# Patient Record
Sex: Female | Born: 1965 | Race: Black or African American | Hispanic: No | Marital: Single | Smoking: Never smoker
Health system: Southern US, Community
[De-identification: ages and names within clinical notes are randomized; demographics above are authoritative.]

## PROBLEM LIST (undated history)

## (undated) DIAGNOSIS — J45909 Unspecified asthma, uncomplicated: Secondary | ICD-10-CM

## (undated) DIAGNOSIS — I10 Essential (primary) hypertension: Secondary | ICD-10-CM

## (undated) HISTORY — DX: Unspecified asthma, uncomplicated: J45.909

## (undated) HISTORY — DX: Essential (primary) hypertension: I10

---

## 2017-01-14 ENCOUNTER — Emergency Department: Payer: BLUE CROSS/BLUE SHIELD

## 2017-01-14 ENCOUNTER — Emergency Department
Admission: EM | Admit: 2017-01-14 | Discharge: 2017-01-14 | Payer: BLUE CROSS/BLUE SHIELD | Attending: Emergency Medicine | Admitting: Emergency Medicine

## 2017-01-14 DIAGNOSIS — Y92014 Private driveway to single-family (private) house as the place of occurrence of the external cause: Secondary | ICD-10-CM | POA: Diagnosis not present

## 2017-01-14 DIAGNOSIS — S065X0A Traumatic subdural hemorrhage without loss of consciousness, initial encounter: Secondary | ICD-10-CM | POA: Insufficient documentation

## 2017-01-14 DIAGNOSIS — S065X9A Traumatic subdural hemorrhage with loss of consciousness of unspecified duration, initial encounter: Secondary | ICD-10-CM

## 2017-01-14 DIAGNOSIS — S0990XA Unspecified injury of head, initial encounter: Secondary | ICD-10-CM | POA: Diagnosis present

## 2017-01-14 DIAGNOSIS — S065XAA Traumatic subdural hemorrhage with loss of consciousness status unknown, initial encounter: Secondary | ICD-10-CM

## 2017-01-14 DIAGNOSIS — Y9301 Activity, walking, marching and hiking: Secondary | ICD-10-CM | POA: Diagnosis not present

## 2017-01-14 DIAGNOSIS — Y999 Unspecified external cause status: Secondary | ICD-10-CM | POA: Diagnosis not present

## 2017-01-14 DIAGNOSIS — W1809XA Striking against other object with subsequent fall, initial encounter: Secondary | ICD-10-CM | POA: Insufficient documentation

## 2017-01-14 LAB — COMPREHENSIVE METABOLIC PANEL
ALT: 17 U/L (ref 14–54)
ANION GAP: 8 (ref 5–15)
AST: 26 U/L (ref 15–41)
Albumin: 4.4 g/dL (ref 3.5–5.0)
Alkaline Phosphatase: 57 U/L (ref 38–126)
BUN: 10 mg/dL (ref 6–20)
CALCIUM: 9.3 mg/dL (ref 8.9–10.3)
CHLORIDE: 102 mmol/L (ref 101–111)
CO2: 28 mmol/L (ref 22–32)
CREATININE: 0.49 mg/dL (ref 0.44–1.00)
Glucose, Bld: 93 mg/dL (ref 65–99)
Potassium: 3.2 mmol/L — ABNORMAL LOW (ref 3.5–5.1)
Sodium: 138 mmol/L (ref 135–145)
Total Bilirubin: 0.7 mg/dL (ref 0.3–1.2)
Total Protein: 8 g/dL (ref 6.5–8.1)

## 2017-01-14 LAB — CBC
HCT: 40.7 % (ref 35.0–47.0)
Hemoglobin: 13.9 g/dL (ref 12.0–16.0)
MCH: 32.1 pg (ref 26.0–34.0)
MCHC: 34.2 g/dL (ref 32.0–36.0)
MCV: 93.8 fL (ref 80.0–100.0)
PLATELETS: 317 10*3/uL (ref 150–440)
RBC: 4.33 MIL/uL (ref 3.80–5.20)
RDW: 12.7 % (ref 11.5–14.5)
WBC: 6.3 10*3/uL (ref 3.6–11.0)

## 2017-01-14 MED ORDER — SODIUM CHLORIDE 0.9 % IV BOLUS (SEPSIS)
1000.0000 mL | Freq: Once | INTRAVENOUS | Status: AC
Start: 2017-01-14 — End: 2017-01-14
  Administered 2017-01-14: 1000 mL via INTRAVENOUS

## 2017-01-14 MED ORDER — MORPHINE SULFATE (PF) 2 MG/ML IV SOLN: 1.0000 mg | Freq: Once | INTRAVENOUS | Status: DC

## 2017-01-14 MED ORDER — SODIUM CHLORIDE 0.9 % IV SOLN
500.0000 mg | Freq: Once | INTRAVENOUS | Status: AC
  Administered 2017-01-14: 500 mg via INTRAVENOUS
  Filled 2017-01-14: qty 5

## 2017-01-14 MED ORDER — ACETAMINOPHEN 325 MG PO TABS
650.0000 mg | ORAL_TABLET | Freq: Once | ORAL | Status: AC
  Administered 2017-01-14: 650 mg via ORAL
  Filled 2017-01-14: qty 2
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
## 2017-12-02 ENCOUNTER — Ambulatory Visit
Admission: RE | Admit: 2017-12-02 | Discharge: 2017-12-02 | Disposition: A | Discharge status: Home / Self Care | Payer: Managed Care, Other (non HMO) | Source: Ambulatory Visit | Attending: Family Medicine | Admitting: Family Medicine

## 2017-12-02 ENCOUNTER — Other Ambulatory Visit: Payer: Self-pay | Admitting: Family Medicine

## 2017-12-02 DIAGNOSIS — M545 Low back pain: Secondary | ICD-10-CM | POA: Diagnosis present

## 2017-12-02 DIAGNOSIS — M47896 Other spondylosis, lumbar region: Secondary | ICD-10-CM | POA: Insufficient documentation

## 2017-12-25 ENCOUNTER — Other Ambulatory Visit: Payer: Self-pay | Admitting: Family Medicine

## 2017-12-25 DIAGNOSIS — Z1231 Encounter for screening mammogram for malignant neoplasm of breast: Secondary | ICD-10-CM

## 2019-06-17 DIAGNOSIS — Z111 Encounter for screening for respiratory tuberculosis: Secondary | ICD-10-CM | POA: Diagnosis not present

## 2019-06-17 DIAGNOSIS — L02212 Cutaneous abscess of back [any part, except buttock]: Secondary | ICD-10-CM | POA: Diagnosis not present

## 2019-06-17 DIAGNOSIS — N63 Unspecified lump in unspecified breast: Secondary | ICD-10-CM | POA: Diagnosis not present

## 2019-06-17 DIAGNOSIS — L0291 Cutaneous abscess, unspecified: Secondary | ICD-10-CM | POA: Diagnosis not present

## 2019-06-17 DIAGNOSIS — I10 Essential (primary) hypertension: Secondary | ICD-10-CM | POA: Diagnosis not present

## 2019-06-17 DIAGNOSIS — Z23 Encounter for immunization: Secondary | ICD-10-CM | POA: Diagnosis not present

## 2019-06-17 DIAGNOSIS — M545 Low back pain: Secondary | ICD-10-CM | POA: Diagnosis not present

## 2019-06-21 DIAGNOSIS — N6320 Unspecified lump in the left breast, unspecified quadrant: Secondary | ICD-10-CM | POA: Diagnosis not present

## 2019-06-21 DIAGNOSIS — M545 Low back pain: Secondary | ICD-10-CM | POA: Diagnosis not present

## 2019-06-21 DIAGNOSIS — M5431 Sciatica, right side: Secondary | ICD-10-CM | POA: Diagnosis not present

## 2019-06-21 DIAGNOSIS — R079 Chest pain, unspecified: Secondary | ICD-10-CM | POA: Diagnosis not present

## 2019-06-21 DIAGNOSIS — R9431 Abnormal electrocardiogram [ECG] [EKG]: Secondary | ICD-10-CM | POA: Diagnosis not present

## 2019-06-24 DIAGNOSIS — R0602 Shortness of breath: Secondary | ICD-10-CM | POA: Diagnosis not present

## 2019-06-24 DIAGNOSIS — I208 Other forms of angina pectoris: Secondary | ICD-10-CM | POA: Diagnosis not present

## 2019-06-24 DIAGNOSIS — J4520 Mild intermittent asthma, uncomplicated: Secondary | ICD-10-CM | POA: Diagnosis not present

## 2019-06-24 DIAGNOSIS — E6609 Other obesity due to excess calories: Secondary | ICD-10-CM | POA: Diagnosis not present

## 2019-06-30 DIAGNOSIS — N6325 Unspecified lump in the left breast, overlapping quadrants: Secondary | ICD-10-CM | POA: Diagnosis not present

## 2019-06-30 DIAGNOSIS — N63 Unspecified lump in unspecified breast: Secondary | ICD-10-CM | POA: Diagnosis not present

## 2019-07-13 ENCOUNTER — Other Ambulatory Visit: Payer: Self-pay

## 2019-07-13 ENCOUNTER — Ambulatory Visit (LOCAL_COMMUNITY_HEALTH_CENTER): Payer: Self-pay

## 2019-07-13 ENCOUNTER — Other Ambulatory Visit: Payer: Managed Care, Other (non HMO)

## 2019-07-13 DIAGNOSIS — Z111 Encounter for screening for respiratory tuberculosis: Secondary | ICD-10-CM

## 2019-07-13 DIAGNOSIS — R0602 Shortness of breath: Secondary | ICD-10-CM | POA: Diagnosis not present

## 2019-07-13 DIAGNOSIS — I208 Other forms of angina pectoris: Secondary | ICD-10-CM | POA: Diagnosis not present

## 2019-07-16 ENCOUNTER — Other Ambulatory Visit: Payer: Self-pay

## 2019-07-16 ENCOUNTER — Ambulatory Visit (LOCAL_COMMUNITY_HEALTH_CENTER): Payer: Self-pay

## 2019-07-16 DIAGNOSIS — Z111 Encounter for screening for respiratory tuberculosis: Secondary | ICD-10-CM

## 2019-07-16 LAB — TB SKIN TEST
Induration: 0 mm
TB Skin Test: NEGATIVE

## 2019-07-22 DIAGNOSIS — J4520 Mild intermittent asthma, uncomplicated: Secondary | ICD-10-CM | POA: Diagnosis not present

## 2019-07-22 DIAGNOSIS — I208 Other forms of angina pectoris: Secondary | ICD-10-CM | POA: Diagnosis not present

## 2019-07-22 DIAGNOSIS — E6609 Other obesity due to excess calories: Secondary | ICD-10-CM | POA: Diagnosis not present

## 2019-07-22 DIAGNOSIS — R0602 Shortness of breath: Secondary | ICD-10-CM | POA: Diagnosis not present

## 2019-08-24 DIAGNOSIS — Z20828 Contact with and (suspected) exposure to other viral communicable diseases: Secondary | ICD-10-CM | POA: Diagnosis not present

## 2019-08-31 DIAGNOSIS — I10 Essential (primary) hypertension: Secondary | ICD-10-CM | POA: Diagnosis not present

## 2019-08-31 DIAGNOSIS — Z79899 Other long term (current) drug therapy: Secondary | ICD-10-CM | POA: Diagnosis not present

## 2019-08-31 DIAGNOSIS — E876 Hypokalemia: Secondary | ICD-10-CM | POA: Diagnosis not present

## 2019-08-31 DIAGNOSIS — J4520 Mild intermittent asthma, uncomplicated: Secondary | ICD-10-CM | POA: Diagnosis not present

## 2019-08-31 DIAGNOSIS — S161XXA Strain of muscle, fascia and tendon at neck level, initial encounter: Secondary | ICD-10-CM | POA: Diagnosis not present

## 2019-11-01 DIAGNOSIS — Z6841 Body Mass Index (BMI) 40.0 and over, adult: Secondary | ICD-10-CM | POA: Diagnosis not present

## 2019-11-24 DIAGNOSIS — M545 Low back pain: Secondary | ICD-10-CM | POA: Diagnosis not present

## 2019-11-29 DIAGNOSIS — X501XXA Overexertion from prolonged static or awkward postures, initial encounter: Secondary | ICD-10-CM | POA: Diagnosis not present

## 2019-11-29 DIAGNOSIS — S39012A Strain of muscle, fascia and tendon of lower back, initial encounter: Secondary | ICD-10-CM | POA: Diagnosis not present

## 2019-11-29 DIAGNOSIS — Y9301 Activity, walking, marching and hiking: Secondary | ICD-10-CM | POA: Diagnosis not present

## 2020-02-10 DIAGNOSIS — N390 Urinary tract infection, site not specified: Secondary | ICD-10-CM | POA: Diagnosis not present

## 2020-02-10 DIAGNOSIS — N760 Acute vaginitis: Secondary | ICD-10-CM | POA: Diagnosis not present

## 2020-02-10 DIAGNOSIS — B373 Candidiasis of vulva and vagina: Secondary | ICD-10-CM | POA: Diagnosis not present

## 2020-02-22 ENCOUNTER — Other Ambulatory Visit: Payer: Self-pay | Admitting: *Deleted

## 2020-02-22 NOTE — Patient Outreach (Signed)
[facility]) Care Management  02/22/2020  Ashley Meyer 1966/03/16 [id number]   Subjective: Telephone call to patient's home number, no answer, left HIPAA compliant voicemail message, and requested call back.   Objective: Per KPN (Knowledge Performance Now, point of care tool) and chart review, patient has not had any recent hospitalizations or ED visits. Patient also has a history of hypertension, migraine, asthma, Multiple thyroid nodules,  And Bilateral presbyopia.      Assessment:  Received Arts development officer Hypertension Initiative referral on 01/24/2020.  Referral source: [employer]. Referral reason: " As a benefit of your [employer] plan, you have been chosen to participate in a blood pressure program. You will receive a blood pressure monitor in the mail with information to help you better manage your health."  Screening  follow up pending patient contact.      Plan: RNCM will send unsuccessful outreach  letter, [facility] pamphlet, will call patient for 2nd telephone outreach attempt within 4 business days, screening follow up, and will proceed with case closure within 10 business days if no return call, if 4th unsuccessful outreach call.     Margret Moat H. Gardiner Barefoot, BSN, CCM [facility] Care Management [facility] Telephonic CM Phone: [phone] Fax: [phone]

## 2020-02-25 ENCOUNTER — Encounter: Payer: Self-pay | Admitting: *Deleted

## 2020-02-25 ENCOUNTER — Other Ambulatory Visit: Payer: Self-pay | Admitting: *Deleted

## 2020-02-25 NOTE — Patient Outreach (Signed)
[facility]) Care Management  02/25/2020  Ashley Meyer 15-Sep-1966 [id number]   Subjective: Telephone call to patient's home / mobile number, spoke with patient, and HIPAA verified.  Discussed [facility] Care Management [employer] Commercial Hypertension Initiative referral follow up, patient voiced understanding, and is in agreement to follow up.  Patient states she is doing well, blood pressure monitor is broken, and last blood pressure was 128/68 on 02/23/2020.  States is normally takes blood pressure daily but monitor has been broken since 02/23/2020.  RNCM advised will send patient new blood pressure monitor.  Patient states he is able to manage self care and has assistance as needed.  Patient voices understanding of medical diagnosis and treatment plan.  Patient states she had surgery to remove a growth on thyroid in 2008.   States she has seen a cardiologist in the past due to irregular heart beat related to low potassium, cleared by cardiologist, and will have a follow up in 1 year.  Patient states she has no upcoming scheduled appointments and will be scheduling a physical in the near future.  States her primary provider is with [facility] and she sees whoever is available.  States she is accessing her [employer] benefits as needed via member services number on back of card.  Patient states she does not have any transportation, [employer], or pharmacy needs at this time.  States she is very appreciative of the follow up and is in agreement to continue to receive [facility] Care Management information / services.    Objective: Per KPN (Knowledge Performance Now, point of care tool) and chart review, patient has not had any recent hospitalizations or ED visits. Patient also has a history of hypertension, migraine, asthma, Multiple thyroid nodules,  and Bilateral presbyopia.      Assessment:  Received Arts development officer Hypertension Initiative referral on 01/24/2020.   Referral source: [employer]. Referral reason: " As a benefit of your [employer] plan, you have been chosen to participate in a blood pressure program. You will receive a blood pressure monitor in the mail with information to help you better manage your health."  Screening follow up completed and will follow up to completed hypertension assessment.      Plan: RNCM will send patient welcome outreach letter, welcome packet, consent, THN pamphlet, blood pressure monitor, low salt chart/ guide, Advanced Directive packet, and magnet. RNCM will call patient for telephone outreach attempt, within 14 business days, to complete hypertension assessment , and proceed with case closure, within 10 business days if no return call, after 4th unsuccessful outreach call.      Darlene H. Gardiner Barefoot, BSN, CCM [facility] Care Management [facility] Telephonic CM Phone: [phone] Fax: [phone]

## 2020-03-09 ENCOUNTER — Ambulatory Visit: Payer: Self-pay | Admitting: *Deleted

## 2020-03-09 DIAGNOSIS — H40023 Open angle with borderline findings, high risk, bilateral: Secondary | ICD-10-CM | POA: Diagnosis not present

## 2020-03-09 DIAGNOSIS — H2513 Age-related nuclear cataract, bilateral: Secondary | ICD-10-CM | POA: Diagnosis not present

## 2020-03-10 ENCOUNTER — Ambulatory Visit: Payer: Self-pay | Admitting: *Deleted

## 2020-03-13 ENCOUNTER — Ambulatory Visit: Payer: Self-pay | Admitting: *Deleted

## 2020-03-14 ENCOUNTER — Ambulatory Visit: Payer: Self-pay | Admitting: *Deleted

## 2020-03-15 ENCOUNTER — Other Ambulatory Visit: Payer: Self-pay | Admitting: *Deleted

## 2020-03-15 NOTE — Patient Outreach (Addendum)
[facility]) Care Management  [place]  03/15/2020   Ashley Meyer May 03, 1966 [id number]  Subjective: Telephone call to patient's home number, spoke with patient, and HIPAA verified.  Patient states she is doing well and things are going good.   States she received [facility] Care Management information and blood pressure monitor, very appreciative, and has no questions regarding educational materials.  States she modifying her diet and has lost 8 lbs to date. States her blood pressure has been in the 128/82 - 129/84 range and taking medications as prescribed.  States she has no upcoming MD appointments in the near future.  Patient states she has had periodic pain in right great toe since she had a pedicure last week, no signs/ symptoms of infection, and is planning to follow up with MD if needed. Patient states she is aware of signs/ symptoms to report, how to reach provider if needed after hours, when to go to ED, and / or call 911.  States she is planning to follow up with gastroenterologist regarding scheduling a follow up colonoscopy as a recommended by provider approximately 2 years ago,  since it was delayed due to Covid restrictions, and things are opening back up.  Patient states she does not have any transportation, [employer], or pharmacy needs at this time. States she is very appreciative of the follow up and is in agreement to continue to receive [facility] Management information / services.    Objective: Per KPN (Knowledge Performance Now, point of care tool) and chart review,patient has not had any recent hospitalizations or ED visits. Patient also has a history of hypertension, migraine, asthma,Multiple thyroid nodules, and Bilateral presbyopia.    Encounter Medications:  Outpatient Encounter Medications as of 03/15/2020  Medication Sig Note  . albuterol (VENTOLIN HFA) 108 (90 Base) MCG/ACT inhaler Inhale into the lungs.   Marland Kitchen amLODipine (NORVASC) 5 MG  tablet Take 5 mg by mouth daily.   . cetirizine (ZYRTEC) 10 MG tablet Take by mouth.   . hydrochlorothiazide (HYDRODIURIL) 12.5 MG tablet Take by mouth.   . Multiple Vitamin (MULTIVITAMIN) capsule Take by mouth.   . nitroGLYCERIN (NITROSTAT) 0.3 MG SL tablet TAKE AS DIRECTED FOR CHEST PAIN 02/25/2020: States has not needed.   . potassium chloride (MICRO-K) 10 MEQ CR capsule TAKE 1 CAPSULE(10 MEQ) BY MOUTH EVERY DAY    No facility-administered encounter medications on file as of 03/15/2020.    Functional Status:  In your present state of health, do you have any difficulty performing the following activities: 03/15/2020 02/25/2020  Hearing? N -  Vision? N -  Difficulty concentrating or making decisions? N -  Walking or climbing stairs? - N  Dressing or bathing? - N  Doing errands, shopping? - N  Conservation officer, nature and eating ? - N  Using the Toilet? - N  In the past six months, have you accidently leaked urine? N -  Do you have problems with loss of bowel control? N -  Managing your Medications? - N  Managing your Finances? - N  Housekeeping or managing your Housekeeping? - N  Some recent data might be hidden    Fall/Depression Screening: Fall Risk  02/25/2020  Falls in the past year? 1  Number falls in past yr: 0  Injury with Fall? 1  Risk for fall due to : History of fall(s)  Follow up Falls evaluation completed;Education provided;Falls prevention discussed   PHQ 2/9 Scores 02/25/2020  PHQ - 2 Score 0  Assessment: Received Arts development officer Hypertension Initiative referral on 01/24/2020. Referral source: [employer]. Referral reason:"As a benefit of your [employer] plan, you have been chosen to participate in a blood pressure program. You will receive a blood pressure monitor in the mail with information to help you better manage your health." Screening follow up and hypertension assessment completed.  Will follow up for hypertension disease monitoring and education.    THN CM  Care Plan Problem One     Most Recent Value  Care Plan Problem One Knowledge deficit of hypertension management  Role Documenting the Problem One Care Management Telephonic Coordinator  Care Plan for Problem One Active  THN Long Term Goal  Over the next 90 days, patient will not be hospitalized for complications related to chronic illnesses.  THN Long Term Goal Start Date 02/25/20  [Late entry]  Interventions for Problem One Long Term Goal Discussed hypertension screening assessment results and follow up plan.  THN CM Short Term Goal #1  Patient will be able to verbalize in the next 31 days  3 signs & symptoms to report to MD for hypertension.  THN CM Short Term Goal #1 Start Date 02/25/20  Interventions for Short Term Goal #1 Discussed signs and symptoms hypertension.  THN CM Short Term Goal #2  Over the next  31 days, patient will not be hospitalized for complications related to chronic illnesses.  THN CM Short Term Goal #2 Start Date 02/25/20  Interventions for Short Term Goal #2 Discussed blood pressure monitoring ranges and establish baseline.      Plan: RNCM will send assistance request for faxing MD involvement letter and routing assessment to patient's primary provider, to Iverson Alamin at [facility] Care Management.  Provider not currently in Epic and RNCM will send provider information within 3 business days of receiving assistance.   RNCM will call patient for telephone outreach attempt, within 30 business days, follow up on hypertension assessment , and proceed with case closure, within 10 business days if no return call, after 4th unsuccessful outreach call.     Blanka Rockholt H. Gardiner Barefoot, BSN, CCM [facility] Care Management [facility] Telephonic CM Phone: [phone] Fax: [phone]

## 2020-03-16 ENCOUNTER — Other Ambulatory Visit: Payer: Self-pay | Admitting: *Deleted

## 2020-03-16 NOTE — Patient Outreach (Signed)
[facility]) Care Management  03/16/2020  Lavonia Eager 02-08-1966 [id number]   MD Involvement letter faxed and 03/15/2020 patient encounter (assessment) routed to patient's primary provider at [facility] (Felipa Eth Ray Physician Assistant) via [facility] / Epic.       Ourania Hamler H. Gardiner Barefoot, BSN, CCM [facility] Care Management [facility] Telephonic CM Phone: [phone] Fax: [phone]

## 2020-03-28 DIAGNOSIS — Z20822 Contact with and (suspected) exposure to covid-19: Secondary | ICD-10-CM | POA: Diagnosis not present

## 2020-04-13 ENCOUNTER — Ambulatory Visit: Payer: Self-pay | Admitting: *Deleted

## 2020-04-17 ENCOUNTER — Emergency Department: Payer: 59

## 2020-04-17 ENCOUNTER — Emergency Department
Admission: EM | Admit: 2020-04-17 | Discharge: 2020-04-17 | Disposition: A | Discharge status: Home / Self Care | Payer: 59 | Attending: Emergency Medicine | Admitting: Emergency Medicine

## 2020-04-17 ENCOUNTER — Other Ambulatory Visit: Payer: Self-pay

## 2020-04-17 DIAGNOSIS — I10 Essential (primary) hypertension: Secondary | ICD-10-CM | POA: Diagnosis not present

## 2020-04-17 DIAGNOSIS — M542 Cervicalgia: Secondary | ICD-10-CM | POA: Diagnosis not present

## 2020-04-17 DIAGNOSIS — J45909 Unspecified asthma, uncomplicated: Secondary | ICD-10-CM | POA: Insufficient documentation

## 2020-04-17 DIAGNOSIS — R079 Chest pain, unspecified: Secondary | ICD-10-CM | POA: Diagnosis not present

## 2020-04-17 DIAGNOSIS — Z7951 Long term (current) use of inhaled steroids: Secondary | ICD-10-CM | POA: Insufficient documentation

## 2020-04-17 DIAGNOSIS — Z79899 Other long term (current) drug therapy: Secondary | ICD-10-CM | POA: Insufficient documentation

## 2020-04-17 DIAGNOSIS — S0990XA Unspecified injury of head, initial encounter: Secondary | ICD-10-CM | POA: Diagnosis not present

## 2020-04-17 DIAGNOSIS — R519 Headache, unspecified: Secondary | ICD-10-CM | POA: Diagnosis not present

## 2020-04-17 DIAGNOSIS — S199XXA Unspecified injury of neck, initial encounter: Secondary | ICD-10-CM | POA: Diagnosis not present

## 2020-04-17 MED ORDER — CYCLOBENZAPRINE HCL 5 MG PO TABS: ORAL_TABLET | ORAL | 0 refills | Status: AC

## 2020-04-17 MED ORDER — ACETAMINOPHEN 500 MG PO TABS: 500.0000 mg | ORAL_TABLET | Freq: Four times a day (QID) | ORAL | 0 refills | Status: AC | PRN
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
## 2020-04-18 ENCOUNTER — Ambulatory Visit: Payer: Self-pay | Admitting: *Deleted

## 2020-04-24 ENCOUNTER — Ambulatory Visit: Payer: Self-pay | Admitting: *Deleted

## 2020-04-26 ENCOUNTER — Ambulatory Visit: Payer: Self-pay | Admitting: *Deleted

## 2020-04-26 ENCOUNTER — Other Ambulatory Visit: Payer: Self-pay | Admitting: *Deleted
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
## 2020-05-02 DIAGNOSIS — Z79899 Other long term (current) drug therapy: Secondary | ICD-10-CM | POA: Diagnosis not present

## 2020-05-02 DIAGNOSIS — E559 Vitamin D deficiency, unspecified: Secondary | ICD-10-CM | POA: Diagnosis not present

## 2020-05-02 DIAGNOSIS — R7303 Prediabetes: Secondary | ICD-10-CM | POA: Diagnosis not present

## 2020-05-02 DIAGNOSIS — E7800 Pure hypercholesterolemia, unspecified: Secondary | ICD-10-CM | POA: Diagnosis not present

## 2020-05-02 DIAGNOSIS — M545 Low back pain: Secondary | ICD-10-CM | POA: Diagnosis not present

## 2020-05-02 DIAGNOSIS — Z6839 Body mass index (BMI) 39.0-39.9, adult: Secondary | ICD-10-CM | POA: Diagnosis not present

## 2020-05-02 DIAGNOSIS — D519 Vitamin B12 deficiency anemia, unspecified: Secondary | ICD-10-CM | POA: Diagnosis not present

## 2020-05-02 DIAGNOSIS — E538 Deficiency of other specified B group vitamins: Secondary | ICD-10-CM | POA: Diagnosis not present

## 2020-05-02 DIAGNOSIS — M79601 Pain in right arm: Secondary | ICD-10-CM | POA: Diagnosis not present

## 2020-05-02 DIAGNOSIS — S6991XA Unspecified injury of right wrist, hand and finger(s), initial encounter: Secondary | ICD-10-CM | POA: Diagnosis not present

## 2020-05-02 DIAGNOSIS — J4520 Mild intermittent asthma, uncomplicated: Secondary | ICD-10-CM | POA: Diagnosis not present

## 2020-05-22 ENCOUNTER — Other Ambulatory Visit: Payer: Self-pay | Admitting: *Deleted
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
## 2020-05-24 DIAGNOSIS — Z20822 Contact with and (suspected) exposure to covid-19: Secondary | ICD-10-CM | POA: Diagnosis not present

## 2020-05-25 ENCOUNTER — Ambulatory Visit: Payer: Self-pay | Admitting: *Deleted

## 2020-06-08 DIAGNOSIS — M19021 Primary osteoarthritis, right elbow: Secondary | ICD-10-CM | POA: Diagnosis not present

## 2020-06-08 DIAGNOSIS — M25531 Pain in right wrist: Secondary | ICD-10-CM | POA: Diagnosis not present

## 2020-06-08 DIAGNOSIS — S59901A Unspecified injury of right elbow, initial encounter: Secondary | ICD-10-CM | POA: Diagnosis not present

## 2020-06-19 ENCOUNTER — Other Ambulatory Visit: Payer: Self-pay | Admitting: *Deleted
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
## 2020-06-23 ENCOUNTER — Other Ambulatory Visit: Payer: Self-pay | Admitting: *Deleted

## 2020-07-10 ENCOUNTER — Other Ambulatory Visit: Payer: Self-pay | Admitting: *Deleted

## 2020-07-10 NOTE — Patient Outreach (Signed)
[facility]) Care Management  07/10/2020  Ashley Meyer 1966-03-22 [id number]  Unsuccessful outreach attempt made to patient. Patient answered the phone and stated she was at work and could not speak today.   Plan: RN Health Coach will call patient within the month of November.   [name] RN, BSN [facility] Care Management  RN Health Coach [phone] [name]

## 2020-07-18 DIAGNOSIS — E6609 Other obesity due to excess calories: Secondary | ICD-10-CM | POA: Diagnosis not present

## 2020-07-18 DIAGNOSIS — I208 Other forms of angina pectoris: Secondary | ICD-10-CM | POA: Diagnosis not present

## 2020-07-18 DIAGNOSIS — R0602 Shortness of breath: Secondary | ICD-10-CM | POA: Diagnosis not present

## 2020-07-18 DIAGNOSIS — J4520 Mild intermittent asthma, uncomplicated: Secondary | ICD-10-CM | POA: Diagnosis not present

## 2020-07-24 DIAGNOSIS — Z20822 Contact with and (suspected) exposure to covid-19: Secondary | ICD-10-CM | POA: Diagnosis not present

## 2020-08-09 DIAGNOSIS — Z0189 Encounter for other specified special examinations: Secondary | ICD-10-CM | POA: Diagnosis not present

## 2020-08-16 ENCOUNTER — Other Ambulatory Visit: Payer: Self-pay | Admitting: *Deleted

## 2020-08-16 NOTE — Patient Outreach (Signed)
[facility]) Care Management  08/16/2020  Ashley Meyer 06-23-1966 [id number]  Unsuccessful outreach attempt made to patient. Patient answered the phone and stated that she was getting ready to leave to go to work and asked this nurse to call her during her lunch hour on Friday.  Plan: RN Health Coach will call patient 08/18/20 at 1300.  [name] RN, BSN [facility] Care Management  RN Health Coach [phone] [email or web address]

## 2020-08-18 ENCOUNTER — Other Ambulatory Visit: Payer: Self-pay | Admitting: *Deleted

## 2020-08-18 ENCOUNTER — Encounter: Payer: Self-pay | Admitting: *Deleted

## 2020-08-18 NOTE — Patient Instructions (Addendum)
Goals Addressed            This Visit's Progress   . Eat Healthy       Follow Up Date 11/27/20    - set goal weight - manage portion size - set a realistic goal    Why is this important?   When you are ready to manage your nutrition or weight, having a plan and setting goals will help.  Taking small steps to change how you eat and exercise is a good place to start.    Notes: Patient reports that she is eating healthy and has lost 20 pounds. Her goal is to lose 45 additional pounds by continuing to make healthy food choices and by using portion control. Nurse will send Planning Healthy Meals booklet to patient.     . Track and Manage My Blood Pressure       Follow Up Date 11/27/20    - check blood pressure daily - write blood pressure results in a log or diary    Why is this important?   You won't feel high blood pressure, but it can still hurt your blood vessels.  High blood pressure can cause heart or kidney problems. It can also cause a stroke.  Making lifestyle changes like losing a little weight or eating less salt will help.  Checking your blood pressure at home and at different times of the day can help to control blood pressure.  If the doctor prescribes medicine remember to take it the way the doctor ordered.  Call the office if you cannot afford the medicine or if there are questions about it.     Notes: Patient reports taking her B/P daily and recording the values.

## 2020-08-18 NOTE — Patient Outreach (Signed)
[facility]) Care Management  [facility] Care Manager  08/18/2020   Ashley Meyer [birth year] [id number]  Subjective: Successful telephone outreach call to patient. HIPAA identifiers obtained. Patient reports she is doing well. She is taking her B/P daily and recording the values. Per patient she has not had any recent concerning B.P values and today's B/P was 126/70. Patient is eating a low sodium diet and she states she is eating healthier. She has lost 20 pounds and is working towards losing about 45 more pounds by using portion control and continuing to eat healthy. She explains that her job keeps her active and she does walk occasionally. Patient reports that she has excellent family support, denies any falls, and she is emotionally in a good place. Patient did not have any further questions or concerns today.   Encounter Medications:  Outpatient Encounter Medications as of 08/18/2020  Medication Sig Note  . acetaminophen (TYLENOL) 500 MG tablet Take 1 tablet (500 mg total) by mouth every 6 (six) hours as needed.   Marland Kitchen albuterol (VENTOLIN HFA) 108 (90 Base) MCG/ACT inhaler Inhale into the lungs.   Marland Kitchen amLODipine (NORVASC) 5 MG tablet Take 5 mg by mouth daily.   . cetirizine (ZYRTEC) 10 MG tablet Take by mouth.   . hydrochlorothiazide (HYDRODIURIL) 12.5 MG tablet Take by mouth.   . Multiple Vitamin (MULTIVITAMIN) capsule Take by mouth.   . potassium chloride (MICRO-K) 10 MEQ CR capsule TAKE 1 CAPSULE(10 MEQ) BY MOUTH EVERY DAY   . cyclobenzaprine (FLEXERIL) 5 MG tablet Take 1-2 tablets 3 times daily as needed (Patient not taking: Reported on 08/18/2020) 08/18/2020: Completed prescription  . nitroGLYCERIN (NITROSTAT) 0.3 MG SL tablet TAKE AS DIRECTED FOR CHEST PAIN (Patient not taking: Reported on 08/18/2020) 08/18/2020: Patient states not needed   No facility-administered encounter medications on file as of 08/18/2020.    Functional Status:  In your present state of health,  do you have any difficulty performing the following activities: 03/15/2020 02/25/2020  Hearing? N -  Vision? N -  Difficulty concentrating or making decisions? N -  Walking or climbing stairs? - N  Dressing or bathing? - N  Doing errands, shopping? - N  Quarry manager and eating ? - N  Using the Toilet? - N  In the past six months, have you accidently leaked urine? N -  Do you have problems with loss of bowel control? N -  Managing your Medications? - N  Managing your Finances? - N  Housekeeping or managing your Housekeeping? - N  Some recent data might be hidden    Fall/Depression Screening: Fall Risk  08/18/2020 02/25/2020  Falls in the past year? 0 1  Number falls in past yr: 0 0  Injury with Fall? 0 1  Risk for fall due to : - History of fall(s)  Follow up Falls evaluation completed Falls evaluation completed;Education provided;Falls prevention discussed   PHQ 2/9 Scores 08/18/2020 02/25/2020  PHQ - 2 Score 0 0    Assessment:  Goals Addressed            This Visit's Progress   . Eat Healthy       Follow Up Date 11/27/20    - set goal weight - manage portion size - set a realistic goal    Why is this important?   When you are ready to manage your nutrition or weight, having a plan and setting goals will help.  Taking small steps to change how you eat and exercise  is a good place to start.    Notes: Patient reports that she is eating healthy and has lost 20 pounds. Her goal is to lose 45 additional pounds by continuing to make healthy food choices and by using portion control. Nurse will send Planning Healthy Meals booklet to patient.     . Track and Manage My Blood Pressure       Follow Up Date 11/27/20    - check blood pressure daily - write blood pressure results in a log or diary    Why is this important?   You won't feel high blood pressure, but it can still hurt your blood vessels.  High blood pressure can cause heart or kidney problems. It can also cause a  stroke.  Making lifestyle changes like losing a little weight or eating less salt will help.  Checking your blood pressure at home and at different times of the day can help to control blood pressure.  If the doctor prescribes medicine remember to take it the way the doctor ordered.  Call the office if you cannot afford the medicine or if there are questions about it.     Notes: Patient reports taking her B/P daily and recording the values.      Plan: RN Health Coach will send PCP a barrier letter and today's assessment note, will send patient Advance Directive documents and a Planning Healthy Meals booklet, will call patient within the month of February  and patient agrees to future outreach calls.   [name] RN, BSN [facility] Care Management  RN Health Coach [phone] [email or web address]

## 2020-09-01 DIAGNOSIS — M25552 Pain in left hip: Secondary | ICD-10-CM | POA: Diagnosis not present

## 2020-09-01 DIAGNOSIS — M545 Low back pain, unspecified: Secondary | ICD-10-CM | POA: Diagnosis not present

## 2020-09-01 DIAGNOSIS — M79605 Pain in left leg: Secondary | ICD-10-CM | POA: Diagnosis not present

## 2020-09-01 DIAGNOSIS — Z79899 Other long term (current) drug therapy: Secondary | ICD-10-CM | POA: Diagnosis not present

## 2020-09-01 DIAGNOSIS — Z6839 Body mass index (BMI) 39.0-39.9, adult: Secondary | ICD-10-CM | POA: Diagnosis not present

## 2020-09-01 DIAGNOSIS — J45909 Unspecified asthma, uncomplicated: Secondary | ICD-10-CM | POA: Diagnosis not present

## 2020-09-01 DIAGNOSIS — I10 Essential (primary) hypertension: Secondary | ICD-10-CM | POA: Diagnosis not present

## 2020-09-09 DIAGNOSIS — Z20822 Contact with and (suspected) exposure to covid-19: Secondary | ICD-10-CM | POA: Diagnosis not present

## 2020-09-13 DIAGNOSIS — Z20822 Contact with and (suspected) exposure to covid-19: Secondary | ICD-10-CM | POA: Diagnosis not present

## 2020-09-26 DIAGNOSIS — Z20822 Contact with and (suspected) exposure to covid-19: Secondary | ICD-10-CM | POA: Diagnosis not present

## 2020-09-27 DIAGNOSIS — K047 Periapical abscess without sinus: Secondary | ICD-10-CM | POA: Diagnosis not present

## 2020-09-27 DIAGNOSIS — I10 Essential (primary) hypertension: Secondary | ICD-10-CM | POA: Diagnosis not present

## 2020-11-14 ENCOUNTER — Ambulatory Visit: Payer: Self-pay | Admitting: *Deleted

## 2020-11-17 ENCOUNTER — Other Ambulatory Visit: Payer: Self-pay | Admitting: *Deleted

## 2020-11-17 NOTE — Patient Instructions (Addendum)
Goals Addressed            This Visit's Progress   . [facility] Patient will verbalize continuation of monitoring her B/P and recording the values daily   On track    Timeframe:  Long-Range Goal Priority:  High Start Date: 08/18/20                             Expected End Date: 08/29/21                     Follow Up Date 02/26/21   - check blood pressure daily - write blood pressure results in a log or diary    Why is this important?   You won't feel high blood pressure, but it can still hurt your blood vessels.  High blood pressure can cause heart or kidney problems. It can also cause a stroke.  Making lifestyle changes like losing a little weight or eating less salt will help.  Checking your blood pressure at home and at different times of the day can help to control blood pressure.  If the doctor prescribes medicine remember to take it the way the doctor ordered.  Call the office if you cannot afford the medicine or if there are questions about it.     Notes: Patient reports taking her B/P daily and recording the values.  Updated 11/17/20: Patient Continues to take her B/P daily and record the values. She continues to adhere to a low sodium diet and reports that her blood pressure is under control at this time.     [name] Patient will verbalize continuation or eating healthy for the next 90 days   On track    Timeframe:  Long-Range Goal Priority:  High Start Date: 08/18/20                            Expected End Date: 08/29/21                      Follow Up Date 02/26/21    - change to whole grain breads, cereal, pasta - set goal weight - drink 6 to 8 glasses of water each day - fill half of plate with vegetables - limit fast food meals to no more than 1 per week - manage portion size - prepare main meal at home 3 to 5 days each week - read food labels for fat, fiber, carbohydrates and portion size - reduce red meat to 2 to 3 times a week - set a realistic goal - switch to  low-fat or skim milk    Why is this important?   When you are ready to manage your nutrition or weight, having a plan and setting goals will help.  Taking small steps to change how you eat and exercise is a good place to start.    Notes: Patient reports that she is eating healthy and has lost 20 pounds. Her goal is to lose 45 additional pounds by continuing to make healthy food choices and by using portion control. Nurse will send Planning Healthy Meals booklet to patient.   Updated 11/17/20: Patient states that she has not loss any additional weight and she has not gained any. Patient states she will continue to use portion control, make healthy food choices, limit her sodium intake, and will increase her water intake by drinking 6  bottles of water daily.  She does plan to begin walking as much as her schedule will allow given that she works 2 jobs and has limited downtime.    [name] Patient will verbalize making a mammogram and complete physical appointment within the next 90 days.       Timeframe:  Long-Range Goal Priority:  Medium Start Date: 11/17/20                            Expected End Date: 02/26/21                      Follow Up Date 02/26/21    Notes: Patient states that she needs to make appointments to have a mammogram and complete physical with her provider.

## 2020-11-17 NOTE — Patient Outreach (Signed)
[facility]) Care Management  [facility] Care Manager  11/17/2020   Ashley Meyer [birth year] [id number]  Subjective: Successful telephone outreach call to patient. HIPAA identifiers obtained. Patient reports she is doing well after getting over having covid. Adding that it took a few weeks to fully get back to feeling completely better. Patient states that her B/P is under control. Her B/P today was 120/78 and patient states she has not had any recent elevated spikes. Patient continues to eat healthy and limit her salt intake. She does want to continue to lose weight and explains that she has not loss any weight recently and she has not gained any. Her weight loss plan is to continue to eat healthy, use portion control, and walk when time allows as she does work 2 jobs and her downtime is limited. Patient did not have any further questions or concerns today and did confirm that the patient has this nurse's contact number to call her if needed.    Encounter Medications:  Outpatient Encounter Medications as of 11/17/2020  Medication Sig Note  . acetaminophen (TYLENOL) 500 MG tablet Take 1 tablet (500 mg total) by mouth every 6 (six) hours as needed.   Marland Kitchen amLODipine (NORVASC) 5 MG tablet Take 5 mg by mouth daily.   . cetirizine (ZYRTEC) 10 MG tablet Take by mouth.   . hydrochlorothiazide (HYDRODIURIL) 12.5 MG tablet Take by mouth.   . Multiple Vitamin (MULTIVITAMIN) capsule Take by mouth.   . potassium chloride (MICRO-K) 10 MEQ CR capsule TAKE 1 CAPSULE(10 MEQ) BY MOUTH EVERY DAY   . albuterol (VENTOLIN HFA) 108 (90 Base) MCG/ACT inhaler Inhale into the lungs.   . cyclobenzaprine (FLEXERIL) 5 MG tablet Take 1-2 tablets 3 times daily as needed (Patient not taking: No sig reported) 11/17/2020: Not needed  . nitroGLYCERIN (NITROSTAT) 0.3 MG SL tablet TAKE AS DIRECTED FOR CHEST PAIN (Patient not taking: No sig reported) 11/17/2020: Patient states not needed   No facility-administered  encounter medications on file as of 11/17/2020.    Functional Status:  In your present state of health, do you have any difficulty performing the following activities: 03/15/2020 02/25/2020  Hearing? N -  Vision? N -  Difficulty concentrating or making decisions? N -  Walking or climbing stairs? - N  Dressing or bathing? - N  Doing errands, shopping? - N  Quarry manager and eating ? - N  Using the Toilet? - N  In the past six months, have you accidently leaked urine? N -  Do you have problems with loss of bowel control? N -  Managing your Medications? - N  Managing your Finances? - N  Housekeeping or managing your Housekeeping? - N  Some recent data might be hidden    Fall/Depression Screening: Fall Risk  08/18/2020 02/25/2020  Falls in the past year? 0 1  Number falls in past yr: 0 0  Injury with Fall? 0 1  Risk for fall due to : - History of fall(s)  Follow up Falls evaluation completed Falls evaluation completed;Education provided;Falls prevention discussed   PHQ 2/9 Scores 08/18/2020 02/25/2020  PHQ - 2 Score 0 0    Assessment:  Goals Addressed            This Visit's Progress   . [facility] Patient will verbalize continuation of monitoring her B/P and recording the values daily   On track    Timeframe:  Long-Range Goal Priority:  High Start Date: 08/18/20  Expected End Date: 08/29/21                     Follow Up Date 02/26/21   - check blood pressure daily - write blood pressure results in a log or diary    Why is this important?   You won't feel high blood pressure, but it can still hurt your blood vessels.  High blood pressure can cause heart or kidney problems. It can also cause a stroke.  Making lifestyle changes like losing a little weight or eating less salt will help.  Checking your blood pressure at home and at different times of the day can help to control blood pressure.  If the doctor prescribes medicine remember to take it the  way the doctor ordered.  Call the office if you cannot afford the medicine or if there are questions about it.     Notes: Patient reports taking her B/P daily and recording the values.  Updated 11/17/20: Patient Continues to take her B/P daily and record the values. She continues to adhere to a low sodium diet and reports that her blood pressure is under control at this time.     [name] Patient will verbalize continuation or eating healthy for the next 90 days       Timeframe:  Long-Range Goal Priority:  High Start Date: 08/18/20                            Expected End Date: 08/29/21                      Follow Up Date 02/26/21    - change to whole grain breads, cereal, pasta - set goal weight - drink 6 to 8 glasses of water each day - fill half of plate with vegetables - limit fast food meals to no more than 1 per week - manage portion size - prepare main meal at home 3 to 5 days each week - read food labels for fat, fiber, carbohydrates and portion size - reduce red meat to 2 to 3 times a week - set a realistic goal - switch to low-fat or skim milk    Why is this important?   When you are ready to manage your nutrition or weight, having a plan and setting goals will help.  Taking small steps to change how you eat and exercise is a good place to start.    Notes: Patient reports that she is eating healthy and has lost 20 pounds. Her goal is to lose 45 additional pounds by continuing to make healthy food choices and by using portion control. Nurse will send Planning Healthy Meals booklet to patient.   Updated 11/17/20: Patient states that she has not loss any additional weight and she has not gained any. Patient states she will continue to use portion control, make healthy food choices, limit her sodium intake, and will increase her water intake by drinking 6 bottles of water daily.     [name] Patient will verbalize making a mammogram and complete physical appointment within the next 90  days.       Timeframe:  Long-Range Goal Priority:  Medium Start Date: 11/17/20                            Expected End Date: 02/26/21  Follow Up Date 02/26/21    Notes: Patient states that she needs to make appointments to have a mammogram and complete physical with her provider.       Plan: RN Health Coach will call patient within the month of May. Follow-up:  Patient agrees to Care Plan and Follow-up.   [name] RN, BSN [facility] Care Management  RN Health Coach [phone] [email or web address]

## 2021-02-09 ENCOUNTER — Ambulatory Visit: Payer: Self-pay | Admitting: *Deleted

## 2021-02-14 ENCOUNTER — Other Ambulatory Visit: Payer: Self-pay | Admitting: *Deleted

## 2021-02-14 NOTE — Patient Instructions (Signed)
Goals Addressed            This Visit's Progress   . [facility] Patient will verbalize continuation of monitoring her B/P and recording the values daily       Timeframe:  Long-Range Goal Priority:  High Start Date: 08/18/20                             Expected End Date: 08/29/21                     Follow Up Date 02/26/21   - check blood pressure daily - write blood pressure results in a log or diary    Why is this important?   You won't feel high blood pressure, but it can still hurt your blood vessels.  High blood pressure can cause heart or kidney problems. It can also cause a stroke.  Making lifestyle changes like losing a little weight or eating less salt will help.  Checking your blood pressure at home and at different times of the day can help to control blood pressure.  If the doctor prescribes medicine remember to take it the way the doctor ordered.  Call the office if you cannot afford the medicine or if there are questions about it.     Notes: Patient reports taking her B/P daily and recording the values.  Updated 11/17/20: Patient Continues to take her B/P daily and record the values. She continues to adhere to a low sodium diet and reports that her blood pressure is under control at this time.   Updated 02/14/21: Patient states that her B/P values have been excellent and that her hypertension is currently under control. Patient does adhere to a low sodium diet and has begun to go to the gym 3 times a week.     [name] Patient will verbalize continuation or eating healthy for the next 90 days       Timeframe:  Long-Range Goal Priority:  High Start Date: 08/18/20                            Expected End Date: 08/29/21                      Follow Up Date 05/29/21    - change to whole grain breads, cereal, pasta - set goal weight - drink 6 to 8 glasses of water each day - fill half of plate with vegetables - limit fast food meals to no more than 1 per week - manage portion  size - prepare main meal at home 3 to 5 days each week - read food labels for fat, fiber, carbohydrates and portion size - reduce red meat to 2 to 3 times a week - set a realistic goal - switch to low-fat or skim milk    Why is this important?   When you are ready to manage your nutrition or weight, having a plan and setting goals will help.  Taking small steps to change how you eat and exercise is a good place to start.    Notes: Patient reports that she is eating healthy and has lost 20 pounds. Her goal is to lose 45 additional pounds by continuing to make healthy food choices and by using portion control. Nurse will send Planning Healthy Meals booklet to patient.   Updated 11/17/20: Patient states that  she has not loss any additional weight and she has not gained any. Patient states she will continue to use portion control, make healthy food choices, limit her sodium intake, and will increase her water intake by drinking 6 bottles of water daily.  She does plan to begin walking as much as her schedule will allow given that she works 2 jobs and has limited downtime.  Updated 02/14/21: Patient states she has begun to go to the gym 3 day a week, she continues to eat healthy, use portion control, limit sodium. She reports that she has lost another 20  pounds. Nurse congratulated the patient for her dedication to her health and wellness.     . COMPLETED: THN Patient will verbalize making a mammogram and complete physical appointment within the next 90 days.       Timeframe:  Long-Range Goal Priority:  Medium Start Date: 11/17/20                            Expected End Date: 02/26/21                      Follow Up Date 02/14/21    Notes: Patient states that she needs to make appointments to have a mammogram and complete physical with her provider.   Updated 02/14/21: Patient reports that she has completed both appointments.

## 2021-02-14 NOTE — Patient Outreach (Signed)
[facility]) Care Management  [facility] Care Manager  02/14/2021   Ashley Meyer Feb 21, 1966 [id number]  Subjective: Successful telephone outreach call to patient. HIPAA identifiers obtained. Patient reports that she is doing extremely well. She states her blood pressure has been well controlled, she continues to eat healthy, she has begun to go to the gym 3 times weekly, and she has lost another 20 pounds. Nurse congratulated the patient for her dedication to her health and wellness. Patient did not have any further questions or concerns today.    Encounter Medications:  Outpatient Encounter Medications as of 02/14/2021  Medication Sig Note  . acetaminophen (TYLENOL) 500 MG tablet Take 1 tablet (500 mg total) by mouth every 6 (six) hours as needed.   Marland Kitchen albuterol (VENTOLIN HFA) 108 (90 Base) MCG/ACT inhaler Inhale into the lungs.   Marland Kitchen amLODipine (NORVASC) 5 MG tablet Take 5 mg by mouth daily.   . cetirizine (ZYRTEC) 10 MG tablet Take by mouth.   . cyclobenzaprine (FLEXERIL) 5 MG tablet Take 1-2 tablets 3 times daily as needed (Patient not taking: No sig reported) 11/17/2020: Not needed  . hydrochlorothiazide (HYDRODIURIL) 12.5 MG tablet Take by mouth.   . Multiple Vitamin (MULTIVITAMIN) capsule Take by mouth.   . nitroGLYCERIN (NITROSTAT) 0.3 MG SL tablet TAKE AS DIRECTED FOR CHEST PAIN (Patient not taking: No sig reported) 11/17/2020: Patient states not needed  . potassium chloride (MICRO-K) 10 MEQ CR capsule TAKE 1 CAPSULE(10 MEQ) BY MOUTH EVERY DAY    No facility-administered encounter medications on file as of 02/14/2021.    Functional Status:  In your present state of health, do you have any difficulty performing the following activities: 03/15/2020 02/25/2020  Hearing? N -  Vision? N -  Difficulty concentrating or making decisions? N -  Walking or climbing stairs? - N  Dressing or bathing? - N  Doing errands, shopping? - N  Quarry manager and eating ? - N  Using the  Toilet? - N  In the past six months, have you accidently leaked urine? N -  Do you have problems with loss of bowel control? N -  Managing your Medications? - N  Managing your Finances? - N  Housekeeping or managing your Housekeeping? - N  Some recent data might be hidden    Fall/Depression Screening: Fall Risk  02/14/2021 08/18/2020 02/25/2020  Falls in the past year? 0 0 1  Number falls in past yr: 0 0 0  Injury with Fall? 0 0 1  Risk for fall due to : - - History of fall(s)  Follow up Falls evaluation completed Falls evaluation completed Falls evaluation completed;Education provided;Falls prevention discussed   PHQ 2/9 Scores 08/18/2020 02/25/2020  PHQ - 2 Score 0 0    Assessment:  Goals Addressed            This Visit's Progress   . [facility] Patient will verbalize continuation of monitoring her B/P and recording the values daily       Timeframe:  Long-Range Goal Priority:  High Start Date: 08/18/20                             Expected End Date: 08/29/21                     Follow Up Date 02/26/21   - check blood pressure daily - write blood pressure results in a log or diary  Why is this important?   You won't feel high blood pressure, but it can still hurt your blood vessels.  High blood pressure can cause heart or kidney problems. It can also cause a stroke.  Making lifestyle changes like losing a little weight or eating less salt will help.  Checking your blood pressure at home and at different times of the day can help to control blood pressure.  If the doctor prescribes medicine remember to take it the way the doctor ordered.  Call the office if you cannot afford the medicine or if there are questions about it.     Notes: Patient reports taking her B/P daily and recording the values.  Updated 11/17/20: Patient Continues to take her B/P daily and record the values. She continues to adhere to a low sodium diet and reports that her blood pressure is under control at  this time.   Updated 02/14/21: Patient states that her B/P values have been excellent and that her hypertension is currently under control. Patient does adhere to a low sodium diet and has begun to go to the gym 3 times a week.     Ashley Meyer Patient will verbalize continuation or eating healthy for the next 90 days       Timeframe:  Long-Range Goal Priority:  High Start Date: 08/18/20                            Expected End Date: 08/29/21                      Follow Up Date 05/29/21    - change to whole grain breads, cereal, pasta - set goal weight - drink 6 to 8 glasses of water each day - fill half of plate with vegetables - limit fast food meals to no more than 1 per week - manage portion size - prepare main meal at home 3 to 5 days each week - read food labels for fat, fiber, carbohydrates and portion size - reduce red meat to 2 to 3 times a week - set a realistic goal - switch to low-fat or skim milk    Why is this important?   When you are ready to manage your nutrition or weight, having a plan and setting goals will help.  Taking small steps to change how you eat and exercise is a good place to start.    Notes: Patient reports that she is eating healthy and has lost 20 pounds. Her goal is to lose 45 additional pounds by continuing to make healthy food choices and by using portion control. Nurse will send Planning Healthy Meals booklet to patient.   Updated 11/17/20: Patient states that she has not loss any additional weight and she has not gained any. Patient states she will continue to use portion control, make healthy food choices, limit her sodium intake, and will increase her water intake by drinking 6 bottles of water daily.  She does plan to begin walking as much as her schedule will allow given that she works 2 jobs and has limited downtime.  Updated 02/14/21: Patient states she has begun to go to the gym 3 day a week, she continues to eat healthy, use portion control, limit  sodium. She reports that she has lost another 20  pounds. Nurse congratulated the patient for her dedication to her health and wellness.     . COMPLETED: THN Patient  will verbalize making a mammogram and complete physical appointment within the next 90 days.       Timeframe:  Long-Range Goal Priority:  Medium Start Date: 11/17/20                            Expected End Date: 02/26/21                      Follow Up Date 02/14/21    Notes: Patient states that she needs to make appointments to have a mammogram and complete physical with her provider.   Updated 02/14/21: Patient reports that she has completed both appointments.      Plan: RN Health Coach will send PCP a quarterly update and will call patient within the month of August. Follow-up:  Patient agrees to Care Plan and Follow-up.  [name] RN, BSN [facility] Care Management  RN Health Coach [phone] [email or web address]
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
## 2021-05-23 ENCOUNTER — Other Ambulatory Visit: Payer: Self-pay | Admitting: *Deleted

## 2021-05-23 NOTE — Patient Outreach (Signed)
[facility] Care Management  05/23/2021  Seema Blum 1966-05-27 [id number]  Nurse left a VM for patient which informed her that [employer] does not have a contract with [facility] and unfortunately this makes her ineligible for [facility] services. Nurse requested that the patient call her back with any further questions or concerns.    Plan: RN Health Coach will close case, will send a case closed patient letter, and will send a case closed physician [email or web address]   [name] RN, BSN [facility] Care Management  RN Health Coach [phone] [email or web address]

## 2022-01-17 ENCOUNTER — Other Ambulatory Visit: Payer: Self-pay | Admitting: Physician Assistant

## 2022-01-17 DIAGNOSIS — I639 Cerebral infarction, unspecified: Secondary | ICD-10-CM

## 2022-01-18 ENCOUNTER — Ambulatory Visit
Admission: RE | Admit: 2022-01-18 | Discharge: 2022-01-18 | Disposition: A | Discharge status: Home / Self Care | Payer: BC Managed Care – PPO | Source: Ambulatory Visit | Attending: Physician Assistant | Admitting: Physician Assistant

## 2022-01-18 ENCOUNTER — Other Ambulatory Visit: Payer: Managed Care, Other (non HMO)

## 2022-01-18 DIAGNOSIS — I639 Cerebral infarction, unspecified: Secondary | ICD-10-CM

## 2022-07-10 ENCOUNTER — Other Ambulatory Visit: Payer: Self-pay | Admitting: Adult Health

## 2022-07-10 DIAGNOSIS — Z1231 Encounter for screening mammogram for malignant neoplasm of breast: Secondary | ICD-10-CM

## 2023-06-16 ENCOUNTER — Ambulatory Visit: Payer: BC Managed Care – PPO

## 2023-06-16 DIAGNOSIS — Z1211 Encounter for screening for malignant neoplasm of colon: Secondary | ICD-10-CM | POA: Diagnosis present

## 2023-06-16 DIAGNOSIS — Z800 Family history of malignant neoplasm of digestive organs: Secondary | ICD-10-CM | POA: Diagnosis not present

## 2023-06-16 DIAGNOSIS — K640 First degree hemorrhoids: Secondary | ICD-10-CM | POA: Diagnosis not present

## 2024-09-06 ENCOUNTER — Other Ambulatory Visit: Payer: Self-pay | Admitting: Internal Medicine

## 2024-09-06 DIAGNOSIS — I10 Essential (primary) hypertension: Secondary | ICD-10-CM

## 2024-09-13 ENCOUNTER — Ambulatory Visit
Admission: RE | Admit: 2024-09-13 | Discharge: 2024-09-13 | Disposition: A | Source: Ambulatory Visit | Attending: Internal Medicine | Admitting: Internal Medicine

## 2024-09-13 DIAGNOSIS — I10 Essential (primary) hypertension: Secondary | ICD-10-CM | POA: Insufficient documentation
